# Patient Record
Sex: Female | Born: 1990 | Race: Black or African American | Hispanic: No | Marital: Single | Smoking: Never smoker
Health system: Southern US, Community
[De-identification: ages and names within clinical notes are randomized; demographics above are authoritative.]

## PROBLEM LIST (undated history)

## (undated) ENCOUNTER — Inpatient Hospital Stay: Payer: Self-pay

## (undated) DIAGNOSIS — E669 Obesity, unspecified: Secondary | ICD-10-CM

## (undated) DIAGNOSIS — E119 Type 2 diabetes mellitus without complications: Secondary | ICD-10-CM

## (undated) DIAGNOSIS — A6000 Herpesviral infection of urogenital system, unspecified: Secondary | ICD-10-CM

## (undated) HISTORY — PX: OTHER SURGICAL HISTORY: SHX169

---

## 2016-04-22 ENCOUNTER — Emergency Department
Admission: EM | Admit: 2016-04-22 | Discharge: 2016-04-22 | Disposition: A | Discharge status: Home / Self Care | Payer: BLUE CROSS/BLUE SHIELD | Attending: Emergency Medicine | Admitting: Emergency Medicine

## 2016-04-22 ENCOUNTER — Encounter: Payer: Self-pay | Admitting: Emergency Medicine

## 2016-04-22 ENCOUNTER — Emergency Department: Payer: BLUE CROSS/BLUE SHIELD

## 2016-04-22 DIAGNOSIS — Y939 Activity, unspecified: Secondary | ICD-10-CM | POA: Diagnosis not present

## 2016-04-22 DIAGNOSIS — M79604 Pain in right leg: Secondary | ICD-10-CM | POA: Insufficient documentation

## 2016-04-22 DIAGNOSIS — R52 Pain, unspecified: Secondary | ICD-10-CM

## 2016-04-22 DIAGNOSIS — Y929 Unspecified place or not applicable: Secondary | ICD-10-CM | POA: Diagnosis not present

## 2016-04-22 DIAGNOSIS — Y999 Unspecified external cause status: Secondary | ICD-10-CM | POA: Diagnosis not present

## 2016-04-22 DIAGNOSIS — W1839XA Other fall on same level, initial encounter: Secondary | ICD-10-CM | POA: Insufficient documentation

## 2016-04-22 DIAGNOSIS — E119 Type 2 diabetes mellitus without complications: Secondary | ICD-10-CM | POA: Diagnosis not present

## 2016-04-22 HISTORY — DX: Type 2 diabetes mellitus without complications: E11.9

## 2016-04-22 MED ORDER — TRAMADOL HCL 50 MG PO TABS: 50.0000 mg | ORAL_TABLET | Freq: Four times a day (QID) | ORAL | 0 refills | Status: DC | PRN

## 2016-04-22 MED ORDER — GLIPIZIDE 5 MG PO TABS: 5.0000 mg | ORAL_TABLET | Freq: Every day | ORAL | 0 refills | Status: AC

## 2016-04-22 MED ORDER — TRAMADOL HCL 50 MG PO TABS
50.0000 mg | ORAL_TABLET | Freq: Once | ORAL | Status: AC
  Administered 2016-04-22: 50 mg via ORAL
  Filled 2016-04-22: qty 1

## 2016-04-22 NOTE — ED Notes (Signed)
Pt returned from ultrasound

## 2016-04-22 NOTE — ED Provider Notes (Signed)
[facility] Emergency Department Provider Note   ____________________________________________   First MD Initiated Contact with Patient 04/22/16 [phone]     (approximate)  I have reviewed the triage vital signs and the nursing notes.   HISTORY  Chief Complaint Leg Pain    HPI Gloria Bennett is a 25 y.o. female who comes into the hospital today with some right leg pain after a fall. The patient reports that she fell approximately 3 days ago. She reports that since then she's been having pain with her leg. The patient reports that she has been sitting on the floor. She had gotten up and when she took a step on her right leg gave out. The patient reports that she fell and now has pain in the back of her calf. She's been taking Tylenol and Motrin but it has not been helping. The patient was the pain a 7 out of 10 in intensity. She's never had pain like this before. She said she does have some tingling in her toes.   Past Medical History:  Diagnosis Date  . Diabetes mellitus without complication (HCC)     There are no active problems to display for this patient.   History reviewed. No pertinent surgical history.  Prior to Admission medications   Medication Sig Start Date End Date Taking? Authorizing Provider  traMADol (ULTRAM) 50 MG tablet Take 1 tablet (50 mg total) by mouth every 6 (six) hours as needed. 04/22/16   Rebecka ApleyAllison P Naziah Weckerly, MD    Allergies Review of patient's allergies indicates no known allergies.  No family history on file.  Social History Social History  Substance Use Topics  . Smoking status: Never Smoker  . Smokeless tobacco: Not on file  . Alcohol use No    Review of Systems Constitutional: No fever/chills Eyes: No visual changes. ENT: No sore throat. Cardiovascular: Denies chest pain. Respiratory: Denies shortness of breath. Gastrointestinal: No abdominal pain.  No nausea, no vomiting.  No diarrhea.  No  constipation. Genitourinary: Negative for dysuria. Musculoskeletal: Right leg pain Skin: Negative for rash. Neurological: Negative for headaches, focal weakness or numbness.  10-point ROS otherwise negative.  ____________________________________________   PHYSICAL EXAM:  VITAL SIGNS: ED Triage Vitals  Enc Vitals Group     BP 04/22/16 0100 123/68     Pulse Rate 04/22/16 0100 78     Resp 04/22/16 0100 18     Temp 04/22/16 0100 98.1 F (36.7 C)     Temp Source 04/22/16 0100 Oral     SpO2 04/22/16 0100 99 %     Weight 04/22/16 0058 264 lb (119.7 kg)     Height 04/22/16 0058 5\' 6"  (1.676 m)     Head Circumference --      Peak Flow --      Pain Score 04/22/16 0058 7     Pain Loc --      Pain Edu? --      Excl. in GC? --     Constitutional: Alert and oriented. Well appearing and in no acute distress. Eyes: Conjunctivae are normal. PERRL. EOMI. Head: Atraumatic. Nose: No congestion/rhinnorhea. Mouth/Throat: Mucous membranes are moist.  Oropharynx non-erythematous. Cardiovascular: Normal rate, regular rhythm. Grossly normal heart sounds.  Good peripheral circulation. Respiratory: Normal respiratory effort.  No retractions. Lungs CTAB. Gastrointestinal: Soft and nontender. No distention. No abdominal bruits. No CVA tenderness. Musculoskeletal: Tenderness to palpation along right calf muscle Neurologic:  Normal speech and language.  Skin:  Skin is warm, dry  and intact.  Psychiatric: Mood and affect are normal.   ____________________________________________   LABS (all labs ordered are listed, but only abnormal results are displayed)  Labs Reviewed - No data to display ____________________________________________  EKG  none ____________________________________________  RADIOLOGY  X-ray right tib-fib Ultrasound right leg ____________________________________________   PROCEDURES  Procedure(s) performed: None  Procedures  Critical Care performed:  No  ____________________________________________   INITIAL IMPRESSION / ASSESSMENT AND PLAN / ED COURSE  Pertinent labs & imaging results that were available during my care of the patient were reviewed by me and considered in my medical decision making (see chart for details).  This is a 25 year old female who comes into the hospital with pain in her right leg. She reports it started after she fell but she has been sitting on the floor for some time. The patient did have an x-ray to evaluate her leg. I will give the patient dose of Toradol and tender as well for an ultrasound to evaluate for possible DVT.  Clinical Course  Value Comment By Time  DG Tibia/Fibula Right Negative Rebecka Apley, MD 09/19 0410  US Venous Img Lower Unilateral Right No evidence of deep venous thrombosis. Rebecka Apley, MD 09/19 0600    The patient's x-ray and ultrasound are negative. She was sleeping when I went into the room to reevaluate her. The patient will be discharged home to follow-up with her primary care physician. ____________________________________________   FINAL CLINICAL IMPRESSION(S) / ED DIAGNOSES  Final diagnoses:  Pain  Right leg pain      NEW MEDICATIONS STARTED DURING THIS VISIT:  New Prescriptions   TRAMADOL (ULTRAM) 50 MG TABLET    Take 1 tablet (50 mg total) by mouth every 6 (six) hours as needed.     Note:  This document was prepared using Dragon voice recognition software and may include unintentional dictation errors.    Rebecka Apley, MD 04/22/16 [phone]

## 2016-04-22 NOTE — ED Notes (Signed)
Pt went to ultrasound.

## 2016-04-22 NOTE — ED Triage Notes (Signed)
Patient ambulatory to triage with steady gait, without difficulty or distress noted; pt reports on Friday, stood up and fell; c/o persistent right lower leg pain

## 2016-10-10 ENCOUNTER — Emergency Department
Admission: EM | Admit: 2016-10-10 | Discharge: 2016-10-10 | Disposition: A | Discharge status: Home / Self Care | Payer: BLUE CROSS/BLUE SHIELD | Attending: Emergency Medicine | Admitting: Emergency Medicine

## 2016-10-10 DIAGNOSIS — E119 Type 2 diabetes mellitus without complications: Secondary | ICD-10-CM | POA: Insufficient documentation

## 2016-10-10 DIAGNOSIS — Z7984 Long term (current) use of oral hypoglycemic drugs: Secondary | ICD-10-CM | POA: Diagnosis not present

## 2016-10-10 DIAGNOSIS — L02215 Cutaneous abscess of perineum: Secondary | ICD-10-CM | POA: Insufficient documentation

## 2016-10-10 DIAGNOSIS — L0291 Cutaneous abscess, unspecified: Secondary | ICD-10-CM

## 2016-10-10 DIAGNOSIS — Z79899 Other long term (current) drug therapy: Secondary | ICD-10-CM | POA: Diagnosis not present

## 2016-10-10 MED ORDER — FLUCONAZOLE 150 MG PO TABS: 150.0000 mg | ORAL_TABLET | Freq: Every day | ORAL | 0 refills | Status: DC

## 2016-10-10 MED ORDER — LIDOCAINE HCL (PF) 1 % IJ SOLN
10.0000 mL | Freq: Once | INTRAMUSCULAR | Status: AC
  Administered 2016-10-10: 10 mL via INTRADERMAL

## 2016-10-10 MED ORDER — SULFAMETHOXAZOLE-TRIMETHOPRIM 800-160 MG PO TABS
1.0000 | ORAL_TABLET | Freq: Once | ORAL | Status: AC
  Administered 2016-10-10: 1 via ORAL
  Filled 2016-10-10: qty 1

## 2016-10-10 MED ORDER — LIDOCAINE HCL (PF) 1 % IJ SOLN
INTRAMUSCULAR | Status: AC
  Administered 2016-10-10: 10 mL via INTRADERMAL
  Filled 2016-10-10: qty 10

## 2016-10-10 MED ORDER — BACITRACIN ZINC 500 UNIT/GM EX OINT
TOPICAL_OINTMENT | CUTANEOUS | Status: AC
  Administered 2016-10-10: 1
  Filled 2016-10-10: qty 0.9

## 2016-10-10 MED ORDER — SULFAMETHOXAZOLE-TRIMETHOPRIM 800-160 MG PO TABS: 1.0000 | ORAL_TABLET | Freq: Two times a day (BID) | ORAL | 0 refills | Status: AC

## 2016-10-10 NOTE — ED Notes (Signed)
Dr. Manson PasseyBrown at bedside to drain abscess. This RN present as witness. Patient tolerated procedure well. Will continue to monitor.

## 2016-10-10 NOTE — ED Provider Notes (Signed)
[facility] Emergency Department Provider Note    First MD Initiated Contact with Patient 10/10/16 0327     (approximate)  I have reviewed the triage vital signs and the nursing notes.   HISTORY  Chief Complaint Abscess   HPI Gloria Bennett is a 26 y.o. female history diabetes presents with abscess in her vaginal area 4 days. Patient states area has not drained despite the fact that she is use warm soaks without any relief. Patient states her current pain score 6 out of 10. Patient denies any fever or febrile on presentation temperature 98.4.   Past Medical History:  Diagnosis Date  . Diabetes mellitus without complication (HCC)     There are no active problems to display for this patient.   Past surgical history None  Prior to Admission medications   Medication Sig Start Date End Date Taking? Authorizing Provider  glipiZIDE (GLUCOTROL) 5 MG tablet Take 1 tablet (5 mg total) by mouth daily before breakfast. 04/22/16 04/22/17  Rebecka ApleyAllison P Webster, MD  traMADol (ULTRAM) 50 MG tablet Take 1 tablet (50 mg total) by mouth every 6 (six) hours as needed. 04/22/16   Rebecka ApleyAllison P Webster, MD    Allergies Penicillins  No family history on file.  Social History Social History  Substance Use Topics  . Smoking status: Never Smoker  . Smokeless tobacco: Not on file  . Alcohol use No    Review of Systems Constitutional: No fever/chills Eyes: No visual changes. ENT: No sore throat. Cardiovascular: Denies chest pain. Respiratory: Denies shortness of breath. Gastrointestinal: No abdominal pain.  No nausea, no vomiting.  No diarrhea.  No constipation. Genitourinary: Negative for dysuria. Musculoskeletal: Negative for back pain. Skin: Negative for rash.Positive for abscess in the vaginal area Neurological: Negative for headaches, focal weakness or numbness.  10-point ROS otherwise negative.  ____________________________________________   PHYSICAL  EXAM:  VITAL SIGNS: ED Triage Vitals  Enc Vitals Group     BP 10/10/16 0027 135/72     Pulse Rate 10/10/16 0027 94     Resp 10/10/16 0027 18     Temp 10/10/16 0027 98.4 F (36.9 C)     Temp Source 10/10/16 0027 Oral     SpO2 10/10/16 0027 96 %     Weight 10/10/16 0027 270 lb (122.5 kg)     Height 10/10/16 0027 5\' 6"  (1.676 m)     Head Circumference --      Peak Flow --      Pain Score 10/10/16 0028 8     Pain Loc --      Pain Edu? --      Excl. in GC? --     Constitutional: Alert and oriented. Well appearing and in no acute distress. Eyes: Conjunctivae are normal. PERRL. EOMI. Head: Atraumatic. Ears:  Healthy appearing ear canals and TMs bilaterally Nose: No congestion/rhinnorhea. Mouth/Throat: Mucous membranes are moist.  Oropharynx non-erythematous. Neck: No stridor.  Cardiovascular: Normal rate, regular rhythm. Good peripheral circulation. Grossly normal heart sounds. Respiratory: Normal respiratory effort.  No retractions. Lungs CTAB. Gastrointestinal: Soft and nontender. No distention.  Genitourinary: 3 x 3 area of flocculence and swelling noted left inguinal area adjacent to the labia majora with overlying furuncle Musculoskeletal: No lower extremity tenderness nor edema. No gross deformities of extremities. Neurologic:  Normal speech and language. No gross focal neurologic deficits are appreciated.  Skin:  Skin is warm, dry and intact. No rash noted.    Marland Kitchen..Incision and Drainage Date/Time: 10/10/2016 7:16  AM Performed by: Darci Current Authorized by: Darci Current   Consent:    Consent obtained:  Verbal   Consent given by:  Patient   Risks discussed:  Bleeding, incomplete drainage and pain   Alternatives discussed:  No treatment Location:    Type:  Abscess   Location:  Anogenital   Anogenital location:  Perineum Pre-procedure details:    Skin preparation:  Chloraprep Anesthesia (see MAR for exact dosages):    Anesthesia method:  Local infiltration    Local anesthetic:  Lidocaine 1% w/o epi Procedure details:    Needle aspiration: no     Incision types:  Stab incision   Incision depth:  Subcutaneous   Scalpel blade:  11   Drainage:  Purulent and bloody   Drainage amount:  Moderate Post-procedure details:    Patient tolerance of procedure:  Tolerated well, no immediate complications        INITIAL IMPRESSION / ASSESSMENT AND PLAN / ED COURSE  Pertinent labs & imaging results that were available during my care of the patient were reviewed by me and considered in my medical decision making (see chart for details).  Patient given Bactrim the emergency department will be prescribed same for home. Patient is given warning signs of worsening infection which would more return to the emergency department.      ____________________________________________  FINAL CLINICAL IMPRESSION(S) / ED DIAGNOSES  Final diagnoses:  Abscess     MEDICATIONS GIVEN DURING THIS VISIT:  Medications  sulfamethoxazole-trimethoprim (BACTRIM DS,SEPTRA DS) 800-160 MG per tablet 1 tablet (1 tablet Oral Given 10/10/16 0537)  lidocaine (PF) (XYLOCAINE) 1 % injection 10 mL (10 mLs Intradermal Given 10/10/16 0537)  bacitracin 500 UNIT/GM ointment (1 application  Given 10/10/16 0537)     NEW OUTPATIENT MEDICATIONS STARTED DURING THIS VISIT:  New Prescriptions   No medications on file    Modified Medications   No medications on file    Discontinued Medications   No medications on file     Note:  This document was prepared using Dragon voice recognition software and may include unintentional dictation errors.    Darci Current, MD 10/10/16 [phone]
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
## 2017-04-06 ENCOUNTER — Emergency Department
Admission: EM | Admit: 2017-04-06 | Discharge: 2017-04-06 | Disposition: A | Discharge status: Home / Self Care | Payer: BLUE CROSS/BLUE SHIELD | Attending: Emergency Medicine | Admitting: Emergency Medicine

## 2017-04-06 ENCOUNTER — Emergency Department: Payer: BLUE CROSS/BLUE SHIELD

## 2017-04-06 DIAGNOSIS — E119 Type 2 diabetes mellitus without complications: Secondary | ICD-10-CM | POA: Diagnosis not present

## 2017-04-06 DIAGNOSIS — Z7984 Long term (current) use of oral hypoglycemic drugs: Secondary | ICD-10-CM | POA: Insufficient documentation

## 2017-04-06 DIAGNOSIS — Z79899 Other long term (current) drug therapy: Secondary | ICD-10-CM | POA: Insufficient documentation

## 2017-04-06 DIAGNOSIS — S63641A Sprain of metacarpophalangeal joint of right thumb, initial encounter: Secondary | ICD-10-CM | POA: Insufficient documentation

## 2017-04-06 DIAGNOSIS — Y998 Other external cause status: Secondary | ICD-10-CM | POA: Diagnosis not present

## 2017-04-06 DIAGNOSIS — Y929 Unspecified place or not applicable: Secondary | ICD-10-CM | POA: Diagnosis not present

## 2017-04-06 DIAGNOSIS — S6991XA Unspecified injury of right wrist, hand and finger(s), initial encounter: Secondary | ICD-10-CM | POA: Diagnosis present

## 2017-04-06 DIAGNOSIS — Y9389 Activity, other specified: Secondary | ICD-10-CM | POA: Insufficient documentation

## 2017-04-06 MED ORDER — OXYCODONE-ACETAMINOPHEN 5-325 MG PO TABS
1.0000 | ORAL_TABLET | Freq: Once | ORAL | Status: AC
  Administered 2017-04-06: 1 via ORAL
  Filled 2017-04-06: qty 1

## 2017-04-06 MED ORDER — TRAMADOL HCL 50 MG PO TABS: 50.0000 mg | ORAL_TABLET | Freq: Four times a day (QID) | ORAL | 0 refills | Status: DC | PRN

## 2017-04-06 MED ORDER — BACITRACIN ZINC 500 UNIT/GM EX OINT
TOPICAL_OINTMENT | Freq: Two times a day (BID) | CUTANEOUS | Status: DC
  Administered 2017-04-06: 1 via TOPICAL
  Filled 2017-04-06: qty 0.9

## 2017-04-06 MED ORDER — IBUPROFEN 800 MG PO TABS
800.0000 mg | ORAL_TABLET | Freq: Once | ORAL | Status: AC
  Administered 2017-04-06: 800 mg via ORAL
  Filled 2017-04-06: qty 1

## 2017-04-06 MED ORDER — NAPROXEN 500 MG PO TABS
500.0000 mg | ORAL_TABLET | Freq: Two times a day (BID) | ORAL | Status: DC
Start: 2017-04-06 — End: 2018-03-26
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
## 2017-04-06 NOTE — Discharge Instructions (Signed)
Wear splint for 3-5 days as needed. °

## 2017-10-13 ENCOUNTER — Encounter: Payer: Self-pay | Admitting: Emergency Medicine

## 2017-10-13 ENCOUNTER — Other Ambulatory Visit: Payer: Self-pay

## 2017-10-13 ENCOUNTER — Emergency Department
Admission: EM | Admit: 2017-10-13 | Discharge: 2017-10-13 | Disposition: A | Discharge status: Home / Self Care | Payer: BLUE CROSS/BLUE SHIELD | Attending: Emergency Medicine | Admitting: Emergency Medicine

## 2017-10-13 DIAGNOSIS — Z7984 Long term (current) use of oral hypoglycemic drugs: Secondary | ICD-10-CM | POA: Diagnosis not present

## 2017-10-13 DIAGNOSIS — H6501 Acute serous otitis media, right ear: Secondary | ICD-10-CM | POA: Insufficient documentation

## 2017-10-13 DIAGNOSIS — Z79899 Other long term (current) drug therapy: Secondary | ICD-10-CM | POA: Diagnosis not present

## 2017-10-13 DIAGNOSIS — E119 Type 2 diabetes mellitus without complications: Secondary | ICD-10-CM | POA: Insufficient documentation

## 2017-10-13 DIAGNOSIS — H9201 Otalgia, right ear: Secondary | ICD-10-CM | POA: Diagnosis present

## 2017-10-13 DIAGNOSIS — J00 Acute nasopharyngitis [common cold]: Secondary | ICD-10-CM | POA: Diagnosis not present

## 2017-10-13 LAB — GROUP A STREP BY PCR: Group A Strep by PCR: NOT DETECTED

## 2017-10-13 MED ORDER — FLUTICASONE PROPIONATE 50 MCG/ACT NA SUSP: 2.0000 | Freq: Every day | NASAL | 0 refills | Status: DC

## 2017-10-13 NOTE — ED Provider Notes (Signed)
[facility] Emergency Department Provider Note ____________________________________________  Time seen: 2008  I have reviewed the triage vital signs and the nursing notes.  HISTORY  Chief Complaint  Sore Throat and Otalgia  HPI Gloria Bennett is a 27 y.o. female resents to the ED with complaints of sore throat and right-sided earache for the last week.  Patient describes her last 2 days she has had increasing pressure, fullness, and aching to the right ear.  She notes now muffled hearing on that side.  She denies any fevers, chills, sweats, nausea, vomiting, or dizziness.  She also reports that she is approximately [redacted] weeks pregnant.  She has taken Tylenol without significant benefit.  Past Medical History:  Diagnosis Date  . Diabetes mellitus without complication (HCC)     There are no active problems to display for this patient.   No past surgical history on file.  Prior to Admission medications   Medication Sig Start Date End Date Taking? Authorizing Provider  fluconazole (DIFLUCAN) 150 MG tablet Take 1 tablet (150 mg total) by mouth daily. 10/10/16   Darci Current, MD  fluticasone (FLONASE) 50 MCG/ACT nasal spray Place 2 sprays into both nostrils daily. 10/13/17   Penny Frisbie, Charlesetta Ivory, PA-C  glipiZIDE (GLUCOTROL) 5 MG tablet Take 1 tablet (5 mg total) by mouth daily before breakfast. 04/22/16 04/22/17  Rebecka Apley, MD  naproxen (NAPROSYN) 500 MG tablet Take 1 tablet (500 mg total) by mouth 2 (two) times daily with a meal. 04/06/17   Joni Reining, PA-C  traMADol (ULTRAM) 50 MG tablet Take 1 tablet (50 mg total) by mouth every 6 (six) hours as needed. 04/22/16   Rebecka Apley, MD  traMADol (ULTRAM) 50 MG tablet Take 1 tablet (50 mg total) by mouth every 6 (six) hours as needed for moderate pain. 04/06/17   Joni Reining, PA-C    Allergies Amoxicillin and Penicillins  No family history on file.  Social History Social History   Tobacco  Use  . Smoking status: Never Smoker  Substance Use Topics  . Alcohol use: No  . Drug use: Not on file    Review of Systems  Constitutional: Negative for fever. Eyes: Negative for visual changes. ENT: Positive for sore throat.  The ear pressure and fullness as above.  Cardiovascular: Negative for chest pain. Respiratory: Negative for shortness of breath. Musculoskeletal: Negative for back pain. Skin: Negative for rash. Neurological: Negative for headaches, focal weakness or numbness. ____________________________________________  PHYSICAL EXAM:  VITAL SIGNS: ED Triage Vitals [10/13/17 1909]  Enc Vitals Group     BP 127/66     Pulse Rate 90     Resp 18     Temp 98.4 F (36.9 C)     Temp Source Oral     SpO2 98 %     Weight 254 lb (115.2 kg)     Height 5\' 6"  (1.676 m)     Head Circumference      Peak Flow      Pain Score 8     Pain Loc      Pain Edu?      Excl. in GC?     Constitutional: Alert and oriented. Well appearing and in no distress. Head: Normocephalic and atraumatic. Eyes: Conjunctivae are normal. PERRL. Normal extraocular movements Ears: Canals clear. TMs intact bilaterally.  The right TM is injected, bulging, and shows a serous effusion. Nose: No congestion/rhinorrhea/epistaxis.  Nasal turbinates are enlarged with copious rhinorrhea noted  in the right nostril. Mouth/Throat: Mucous membranes are moist.  Uvula is midline and tonsils are flat.  No oropharyngeal lesions or erythema appreciated. Neck: Supple. No thyromegaly. Hematological/Lymphatic/Immunological: No cervical lymphadenopathy. Cardiovascular: Normal rate, regular rhythm. Normal distal pulses. Respiratory: Normal respiratory effort. No wheezes/rales/rhonchi. Gastrointestinal: Soft and nontender. No distention. ____________________________________________   LABS (pertinent positives/negatives) Labs Reviewed  GROUP A STREP BY PCR  ____________________________________________  INITIAL IMPRESSION  / ASSESSMENT AND PLAN / ED COURSE  Patient had [redacted] weeks gestational age with complaints of sore throat and right otalgia.  She is found to have an acute serous otitis on the right and likely symptoms due to vasomotor rhinitis.  Due to her pregnancy state, she is advised she may safely take anti-histamines as well as nasal steroids.  She will avoid decongestants and NSAIDs.  She will follow-up with her primary provider return to the ED as needed. ____________________________________________  FINAL CLINICAL IMPRESSION(S) / ED DIAGNOSES  Final diagnoses:  Right acute serous otitis media, recurrence not specified  Acute rhinitis      Koraline Phillipson, Charlesetta IvoryJenise V Bacon, PA-C 10/13/17 2356    Minna AntisPaduchowski, Kevin, MD 10/14/17 0003

## 2017-10-13 NOTE — ED Triage Notes (Signed)
Pt in with co sore throat and right sided earache since last week.

## 2017-10-13 NOTE — ED Notes (Signed)
Pt in reporting 8/10 aching throat and sharp ear pain. Hearing is muffled Onset 10/07/2017. Works at a nursing home. Took chloraseptic and throat lozenges for throat pain. Pt is [redacted] weeks pregnant so she only took tylenol for pain and reports no relief.

## 2017-10-13 NOTE — Discharge Instructions (Signed)
You have fluid behind the right ear drum, without signs of infection. You may safely tak OTC allergy medicines, (Benadryl, Allegra, Claritin, or Zyrtec). Use the nasal spray as directed. Follow-up with your provider for continued symptoms.

## 2017-11-14 ENCOUNTER — Encounter: Payer: Self-pay | Admitting: Emergency Medicine

## 2017-11-14 ENCOUNTER — Other Ambulatory Visit: Payer: Self-pay

## 2017-11-14 ENCOUNTER — Emergency Department
Admission: EM | Admit: 2017-11-14 | Discharge: 2017-11-14 | Disposition: A | Discharge status: Home / Self Care | Payer: BLUE CROSS/BLUE SHIELD | Attending: Emergency Medicine | Admitting: Emergency Medicine

## 2017-11-14 DIAGNOSIS — R1084 Generalized abdominal pain: Secondary | ICD-10-CM | POA: Diagnosis not present

## 2017-11-14 DIAGNOSIS — O210 Mild hyperemesis gravidarum: Secondary | ICD-10-CM | POA: Diagnosis present

## 2017-11-14 DIAGNOSIS — O9989 Other specified diseases and conditions complicating pregnancy, childbirth and the puerperium: Secondary | ICD-10-CM | POA: Diagnosis not present

## 2017-11-14 DIAGNOSIS — Z7984 Long term (current) use of oral hypoglycemic drugs: Secondary | ICD-10-CM | POA: Diagnosis not present

## 2017-11-14 DIAGNOSIS — Z3A11 11 weeks gestation of pregnancy: Secondary | ICD-10-CM | POA: Diagnosis not present

## 2017-11-14 DIAGNOSIS — O24111 Pre-existing diabetes mellitus, type 2, in pregnancy, first trimester: Secondary | ICD-10-CM | POA: Diagnosis not present

## 2017-11-14 LAB — CBC WITH DIFFERENTIAL/PLATELET
BASOS ABS: 0 10*3/uL (ref 0–0.1)
Basophils Relative: 0 %
EOS PCT: 1 %
Eosinophils Absolute: 0.1 10*3/uL (ref 0–0.7)
HEMATOCRIT: 40.7 % (ref 35.0–47.0)
Hemoglobin: 14.1 g/dL (ref 12.0–16.0)
LYMPHS ABS: 2.2 10*3/uL (ref 1.0–3.6)
Lymphocytes Relative: 34 %
MCH: 24.3 pg — AB (ref 26.0–34.0)
MCHC: 34.5 g/dL (ref 32.0–36.0)
MCV: 70.5 fL — ABNORMAL LOW (ref 80.0–100.0)
MONOS PCT: 8 %
Monocytes Absolute: 0.5 10*3/uL (ref 0.2–0.9)
NEUTROS ABS: 3.8 10*3/uL (ref 1.4–6.5)
Neutrophils Relative %: 57 %
PLATELETS: 242 10*3/uL (ref 150–440)
RBC: 5.78 MIL/uL — ABNORMAL HIGH (ref 3.80–5.20)
RDW: 15.3 % — AB (ref 11.5–14.5)
WBC: 6.7 10*3/uL (ref 3.6–11.0)

## 2017-11-14 LAB — COMPREHENSIVE METABOLIC PANEL
ALT: 22 U/L (ref 14–54)
AST: 17 U/L (ref 15–41)
Albumin: 4 g/dL (ref 3.5–5.0)
Alkaline Phosphatase: 46 U/L (ref 38–126)
Anion gap: 8 (ref 5–15)
BILIRUBIN TOTAL: 0.9 mg/dL (ref 0.3–1.2)
BUN: <5 mg/dL — ABNORMAL LOW (ref 6–20)
CHLORIDE: 103 mmol/L (ref 101–111)
CO2: 24 mmol/L (ref 22–32)
Calcium: 9.5 mg/dL (ref 8.9–10.3)
Creatinine, Ser: 0.47 mg/dL (ref 0.44–1.00)
Glucose, Bld: 104 mg/dL — ABNORMAL HIGH (ref 65–99)
POTASSIUM: 3.5 mmol/L (ref 3.5–5.1)
Sodium: 135 mmol/L (ref 135–145)
TOTAL PROTEIN: 7.3 g/dL (ref 6.5–8.1)

## 2017-11-14 LAB — URINALYSIS, COMPLETE (UACMP) WITH MICROSCOPIC
Bacteria, UA: NONE SEEN
Bilirubin Urine: NEGATIVE
GLUCOSE, UA: NEGATIVE mg/dL
Hgb urine dipstick: NEGATIVE
Ketones, ur: 80 mg/dL — AB
LEUKOCYTES UA: NEGATIVE
Nitrite: NEGATIVE
PH: 6 (ref 5.0–8.0)
Protein, ur: NEGATIVE mg/dL
SPECIFIC GRAVITY, URINE: 1.015 (ref 1.005–1.030)

## 2017-11-14 LAB — LIPASE, BLOOD: LIPASE: 29 U/L (ref 11–51)

## 2017-11-14 MED ORDER — ONDANSETRON HCL 4 MG/2ML IJ SOLN
INTRAMUSCULAR | Status: AC
  Filled 2017-11-14: qty 2

## 2017-11-14 MED ORDER — SODIUM CHLORIDE 0.9 % IV BOLUS
1000.0000 mL | Freq: Once | INTRAVENOUS | Status: AC
  Administered 2017-11-14: 1000 mL via INTRAVENOUS

## 2017-11-14 MED ORDER — PYRIDOXINE HCL 25 MG PO TABS: 25.0000 mg | ORAL_TABLET | Freq: Every evening | ORAL | 0 refills | Status: DC | PRN

## 2017-11-14 MED ORDER — ONDANSETRON HCL 4 MG/2ML IJ SOLN
4.0000 mg | Freq: Once | INTRAMUSCULAR | Status: AC
  Administered 2017-11-14: 4 mg via INTRAVENOUS

## 2017-11-14 MED ORDER — DOXYLAMINE SUCCINATE (SLEEP) 25 MG PO TABS: 25.0000 mg | ORAL_TABLET | Freq: Every evening | ORAL | 0 refills | Status: DC | PRN

## 2017-11-14 NOTE — ED Triage Notes (Signed)
Pt arrives ambulatory to triage with c/o emesis x 3 days. Pt has been having abdominal pain for a loner amount of time but "just started throwing up" 3 days ago. Pt is in NAD.

## 2017-11-14 NOTE — ED Notes (Signed)
Pt awake, up to restroom to void.

## 2017-11-14 NOTE — ED Notes (Signed)
Pt consuming ice chips.

## 2017-11-14 NOTE — ED Notes (Signed)
Pt complains of generalized abd pain for "weeks". Pt states she is 11 weeks gravid with first pregnancy. Pt states she does not have a appetite. Pt states she began having emesis 3 days pta. Pt states "I have 24/7 morning sickness". Pt appears in no acute distress, moist oral mucus membranes present, no skin tenting noted.

## 2017-11-14 NOTE — ED Provider Notes (Signed)
[facility] Emergency Department Provider Note  ___________________________________________   First MD Initiated Contact with Patient 11/14/17 [phone]     (approximate)  I have reviewed the triage vital signs and the nursing notes.   HISTORY  Chief Complaint Emesis During Pregnancy   HPI Gloria Bennett is a 27 y.o. female with a history of diabetes who is approximately [redacted] weeks pregnant who is presenting to the emergency department today with generalized abdominal pain as well as nausea and vomiting over the past 3 days.  The patient denies any vaginal bleeding or discharge.  Denies any burning with urination.  Says that she has been nauseous up to this point in the pregnancy but has not vomited.  Known IUP.  Not taking any antiemetics at home.  Patient is currently taking prenatal vitamins.  Past Medical History:  Diagnosis Date  . Diabetes mellitus without complication (HCC)     There are no active problems to display for this patient.   History reviewed. No pertinent surgical history.  Prior to Admission medications   Medication Sig Start Date End Date Taking? Authorizing Provider  glipiZIDE (GLUCOTROL) 5 MG tablet Take 1 tablet (5 mg total) by mouth daily before breakfast. 04/22/16 11/14/17 Yes Webster, Melchor Amour, MD  metFORMIN (GLUCOPHAGE) 500 MG tablet Take 500 mg by mouth 2 (two) times daily with a meal.   Yes [provider]  Prenatal Vit-Fe Fumarate-FA (PRENATAL MULTIVITAMIN) TABS tablet Take 1 tablet by mouth daily.   Yes [provider]  doxylamine, Sleep, (UNISOM) 25 MG tablet Take 1 tablet (25 mg total) by mouth at bedtime as needed (nausea and vomiting). 11/14/17   Myrna Blazer, MD  fluconazole (DIFLUCAN) 150 MG tablet Take 1 tablet (150 mg total) by mouth daily. 10/10/16   Darci Current, MD  fluticasone (FLONASE) 50 MCG/ACT nasal spray Place 2 sprays into both nostrils daily. 10/13/17   Menshew, Charlesetta Ivory,  PA-C  naproxen (NAPROSYN) 500 MG tablet Take 1 tablet (500 mg total) by mouth 2 (two) times daily with a meal. 04/06/17   Joni Reining, PA-C  pyridOXINE (VITAMIN B-6) 25 MG tablet Take 1 tablet (25 mg total) by mouth at bedtime as needed (nausea and vomiting). 11/14/17   Schaevitz, Myra Rude, MD  traMADol (ULTRAM) 50 MG tablet Take 1 tablet (50 mg total) by mouth every 6 (six) hours as needed. 04/22/16   Rebecka Apley, MD  traMADol (ULTRAM) 50 MG tablet Take 1 tablet (50 mg total) by mouth every 6 (six) hours as needed for moderate pain. 04/06/17   Joni Reining, PA-C    Allergies Amoxicillin and Penicillins  No family history on file.  Social History Social History   Tobacco Use  . Smoking status: Never Smoker  . Smokeless tobacco: Never Used  Substance Use Topics  . Alcohol use: No  . Drug use: Never    Review of Systems  Constitutional: No fever/chills Eyes: No visual changes. ENT: No sore throat. Cardiovascular: Denies chest pain. Respiratory: Denies shortness of breath. Gastrointestinal:  No diarrhea.  No constipation. Genitourinary: Negative for dysuria. Musculoskeletal: Negative for back pain. Skin: Negative for rash. Neurological: Negative for headaches, focal weakness or numbness.  ____________________________________________   PHYSICAL EXAM:  VITAL SIGNS: ED Triage Vitals  Enc Vitals Group     BP 11/14/17 0234 127/73     Pulse Rate 11/14/17 0234 95     Resp 11/14/17 0234 18     Temp 11/14/17 0234  98.6 F (37 C)     Temp Source 11/14/17 0234 Oral     SpO2 11/14/17 0234 99 %     Weight 11/14/17 0235 248 lb (112.5 kg)     Height 11/14/17 0235 5\' 6"  (1.676 m)     Head Circumference --      Peak Flow --      Pain Score 11/14/17 0235 7     Pain Loc --      Pain Edu? --      Excl. in GC? --     Constitutional: Alert and oriented. Well appearing and in no acute distress. Eyes: Conjunctivae are normal.  Head: Atraumatic. Nose: No  congestion/rhinnorhea. Mouth/Throat: Mucous membranes are moist.  Neck: No stridor.   Cardiovascular: Normal rate, regular rhythm. Grossly normal heart sounds.   Respiratory: Normal respiratory effort.  No retractions. Lungs CTAB. Gastrointestinal: Soft with mild and diffuse tenderness to palpation. No distention.  Musculoskeletal: No lower extremity tenderness nor edema.  No joint effusions. Neurologic:  Normal speech and language. No gross focal neurologic deficits are appreciated. Skin:  Skin is warm, dry and intact. No rash noted. Psychiatric: Mood and affect are normal. Speech and behavior are normal.  ____________________________________________   LABS (all labs ordered are listed, but only abnormal results are displayed)  Labs Reviewed  CBC WITH DIFFERENTIAL/PLATELET - Abnormal; Notable for the following components:      Result Value   RBC 5.78 (*)    MCV 70.5 (*)    MCH 24.3 (*)    RDW 15.3 (*)    All other components within normal limits  COMPREHENSIVE METABOLIC PANEL - Abnormal; Notable for the following components:   Glucose, Bld 104 (*)    BUN <5 (*)    All other components within normal limits  URINALYSIS, COMPLETE (UACMP) WITH MICROSCOPIC - Abnormal; Notable for the following components:   Color, Urine YELLOW (*)    APPearance HAZY (*)    Ketones, ur 80 (*)    Squamous Epithelial / LPF 6-30 (*)    All other components within normal limits  LIPASE, BLOOD   ____________________________________________  EKG   ____________________________________________  RADIOLOGY  Bedside ultrasound with fetal heart tones of 162 ____________________________________________   PROCEDURES  Procedure(s) performed:   Procedures  Critical Care performed:   ____________________________________________   INITIAL IMPRESSION / ASSESSMENT AND PLAN / ED COURSE  Pertinent labs & imaging results that were available during my care of the patient were reviewed by me and  considered in my medical decision making (see chart for details).  DDX: Hyperemesis gravidarum, threatened abortion, abdominal pain in pregnancy, nausea and vomiting in pregnancy, gastroenteritis, appendicitis As part of my medical decision making, I reviewed the following data within the electronic MEDICAL RECORD NUMBERNotes from recent outpatient visits  ----------------------------------------- 6:18 AM on 11/14/2017 -----------------------------------------  Patient at this time is able to tolerate crackers as well as p.o. fluids.  When I entered the room she is asleep but is easily awoken.  Patient to be discharged with Unisom as well as pyridoxine to be used as needed.  Patient has a follow-up appointment already this Friday [facility].  Abdominal pain likely secondary to vomiting.  Normal white blood cell count.  Reassuring fetal heart tones.  Unlikely to be acute intra-abdominal process. ____________________________________________   FINAL CLINICAL IMPRESSION(S) / ED DIAGNOSES  Hyperemesis gravidarum.    NEW MEDICATIONS STARTED DURING THIS VISIT:  New Prescriptions   DOXYLAMINE, SLEEP, (UNISOM) 25 MG  TABLET    Take 1 tablet (25 mg total) by mouth at bedtime as needed (nausea and vomiting).   PYRIDOXINE (VITAMIN B-6) 25 MG TABLET    Take 1 tablet (25 mg total) by mouth at bedtime as needed (nausea and vomiting).     Note:  This document was prepared using Dragon voice recognition software and may include unintentional dictation errors.     Myrna Blazer, MD 11/14/17 [phone]

## 2017-12-31 ENCOUNTER — Emergency Department
Admission: EM | Admit: 2017-12-31 | Discharge: 2017-12-31 | Disposition: A | Discharge status: Home / Self Care | Payer: BLUE CROSS/BLUE SHIELD | Attending: Student in an Organized Health Care Education/Training Program | Admitting: Student in an Organized Health Care Education/Training Program

## 2017-12-31 ENCOUNTER — Encounter: Payer: Self-pay | Admitting: Emergency Medicine

## 2017-12-31 ENCOUNTER — Other Ambulatory Visit: Payer: Self-pay

## 2017-12-31 DIAGNOSIS — Z3A15 15 weeks gestation of pregnancy: Secondary | ICD-10-CM | POA: Insufficient documentation

## 2017-12-31 DIAGNOSIS — O210 Mild hyperemesis gravidarum: Secondary | ICD-10-CM | POA: Insufficient documentation

## 2017-12-31 DIAGNOSIS — O24112 Pre-existing diabetes mellitus, type 2, in pregnancy, second trimester: Secondary | ICD-10-CM | POA: Diagnosis not present

## 2017-12-31 DIAGNOSIS — Z7984 Long term (current) use of oral hypoglycemic drugs: Secondary | ICD-10-CM | POA: Diagnosis not present

## 2017-12-31 DIAGNOSIS — O219 Vomiting of pregnancy, unspecified: Secondary | ICD-10-CM | POA: Diagnosis present

## 2017-12-31 LAB — COMPREHENSIVE METABOLIC PANEL
ALBUMIN: 4 g/dL (ref 3.5–5.0)
ALK PHOS: 46 U/L (ref 38–126)
ALT: 18 U/L (ref 14–54)
ANION GAP: 12 (ref 5–15)
AST: 21 U/L (ref 15–41)
BILIRUBIN TOTAL: 0.8 mg/dL (ref 0.3–1.2)
BUN: 6 mg/dL (ref 6–20)
CALCIUM: 9.6 mg/dL (ref 8.9–10.3)
CO2: 20 mmol/L — AB (ref 22–32)
Chloride: 103 mmol/L (ref 101–111)
Creatinine, Ser: 0.45 mg/dL (ref 0.44–1.00)
GFR calc non Af Amer: >60 mL/min (ref >60)
Glucose, Bld: 86 mg/dL (ref 65–99)
POTASSIUM: 3.3 mmol/L — AB (ref 3.5–5.1)
SODIUM: 135 mmol/L (ref 135–145)
TOTAL PROTEIN: 7.8 g/dL (ref 6.5–8.1)

## 2017-12-31 LAB — URINALYSIS, COMPLETE (UACMP) WITH MICROSCOPIC
BACTERIA UA: NONE SEEN
BILIRUBIN URINE: NEGATIVE
GLUCOSE, UA: NEGATIVE mg/dL
Hgb urine dipstick: NEGATIVE
KETONES UR: 80 mg/dL — AB
Leukocytes, UA: NEGATIVE
Nitrite: NEGATIVE
PROTEIN: 30 mg/dL — AB
Specific Gravity, Urine: 1.03 (ref 1.005–1.030)
pH: 5 (ref 5.0–8.0)

## 2017-12-31 LAB — CBC
HCT: 38.7 % (ref 35.0–47.0)
HEMOGLOBIN: 13.3 g/dL (ref 12.0–16.0)
MCH: 24.3 pg — ABNORMAL LOW (ref 26.0–34.0)
MCHC: 34.3 g/dL (ref 32.0–36.0)
MCV: 70.9 fL — ABNORMAL LOW (ref 80.0–100.0)
Platelets: 248 10*3/uL (ref 150–440)
RBC: 5.45 MIL/uL — AB (ref 3.80–5.20)
RDW: 16.7 % — ABNORMAL HIGH (ref 11.5–14.5)
WBC: 7.5 10*3/uL (ref 3.6–11.0)

## 2017-12-31 LAB — LIPASE, BLOOD: Lipase: 33 U/L (ref 11–51)

## 2017-12-31 LAB — HCG, QUANTITATIVE, PREGNANCY: hCG, Beta Chain, Quant, S: 21121 m[IU]/mL — ABNORMAL HIGH (ref <5)

## 2017-12-31 MED ORDER — DOXYLAMINE SUCCINATE (SLEEP) 25 MG PO TABS
25.0000 mg | ORAL_TABLET | Freq: Every day | ORAL | Status: DC
  Administered 2017-12-31: 25 mg via ORAL
  Filled 2017-12-31: qty 1

## 2017-12-31 MED ORDER — DOXYLAMINE-PYRIDOXINE 10-10 MG PO TBEC: 1.0000 | DELAYED_RELEASE_TABLET | Freq: Two times a day (BID) | ORAL | 0 refills | Status: AC

## 2017-12-31 MED ORDER — SODIUM CHLORIDE 0.9 % IV BOLUS
1000.0000 mL | Freq: Once | INTRAVENOUS | Status: AC
  Administered 2017-12-31: 1000 mL via INTRAVENOUS

## 2017-12-31 MED ORDER — METOCLOPRAMIDE HCL 5 MG/ML IJ SOLN
10.0000 mg | Freq: Once | INTRAMUSCULAR | Status: AC
  Administered 2017-12-31: 10 mg via INTRAVENOUS
  Filled 2017-12-31: qty 2

## 2017-12-31 MED ORDER — VITAMIN B-6 50 MG PO TABS
25.0000 mg | ORAL_TABLET | Freq: Every day | ORAL | Status: DC
  Administered 2017-12-31: 25 mg via ORAL
  Filled 2017-12-31: qty 0.5

## 2017-12-31 MED ORDER — DEXTROSE 5 % AND 0.45 % NACL IV BOLUS
1000.0000 mL | Freq: Once | INTRAVENOUS | Status: AC
  Administered 2017-12-31: 1000 mL via INTRAVENOUS
  Filled 2017-12-31: qty 1000

## 2017-12-31 NOTE — ED Provider Notes (Signed)
[facility] Emergency Department Provider Note    First MD Initiated Contact with Patient 12/31/17 1647     (approximate)  I have reviewed the triage vital signs and the nursing notes.   HISTORY  Chief Complaint Emesis During Pregnancy    HPI Gloria Bennett is a 27 y.o. female G1P0 who is roughly [redacted] weeks pregnant presents the ER for nausea and vomiting for the past week.  Patient has required previous admissions to hospital for hyperemesis.  States she was discharged home with likely just but ran out 1 week ago.  She did not follow-up with PCP or her primary OB/GYN despite having recurrence of her nausea vomiting and she did not have any refills.  Presented to the ER for persistent nausea and vomiting concern for dehydration.  Still feels the baby move.  Denies any vaginal discharge.  No dysuria.  No fevers.  No abdominal pain.    Past Medical History:  Diagnosis Date  . Diabetes mellitus without complication (HCC)    No family history on file. History reviewed. No pertinent surgical history. There are no active problems to display for this patient.     Prior to Admission medications   Medication Sig Start Date End Date Taking? Authorizing Provider  doxylamine, Sleep, (UNISOM) 25 MG tablet Take 1 tablet (25 mg total) by mouth at bedtime as needed (nausea and vomiting). 11/14/17   Myrna Blazer, MD  fluconazole (DIFLUCAN) 150 MG tablet Take 1 tablet (150 mg total) by mouth daily. 10/10/16   Darci Current, MD  fluticasone (FLONASE) 50 MCG/ACT nasal spray Place 2 sprays into both nostrils daily. 10/13/17   Menshew, Charlesetta Ivory, PA-C  glipiZIDE (GLUCOTROL) 5 MG tablet Take 1 tablet (5 mg total) by mouth daily before breakfast. 04/22/16 11/14/17  Rebecka Apley, MD  metFORMIN (GLUCOPHAGE) 500 MG tablet Take 500 mg by mouth 2 (two) times daily with a meal.    [provider]  naproxen (NAPROSYN) 500 MG tablet Take 1 tablet (500  mg total) by mouth 2 (two) times daily with a meal. 04/06/17   Joni Reining, PA-C  Prenatal Vit-Fe Fumarate-FA (PRENATAL MULTIVITAMIN) TABS tablet Take 1 tablet by mouth daily.    [provider]  pyridOXINE (VITAMIN B-6) 25 MG tablet Take 1 tablet (25 mg total) by mouth at bedtime as needed (nausea and vomiting). 11/14/17   Schaevitz, Myra Rude, MD  traMADol (ULTRAM) 50 MG tablet Take 1 tablet (50 mg total) by mouth every 6 (six) hours as needed. 04/22/16   Rebecka Apley, MD  traMADol (ULTRAM) 50 MG tablet Take 1 tablet (50 mg total) by mouth every 6 (six) hours as needed for moderate pain. 04/06/17   Joni Reining, PA-C    Allergies Amoxicillin and Penicillins    Social History Social History   Tobacco Use  . Smoking status: Never Smoker  . Smokeless tobacco: Never Used  Substance Use Topics  . Alcohol use: No  . Drug use: Never    Review of Systems Patient denies headaches, rhinorrhea, blurry vision, numbness, shortness of breath, chest pain, edema, cough, abdominal pain, nausea, vomiting, diarrhea, dysuria, fevers, rashes or hallucinations unless otherwise stated above in HPI. ____________________________________________   PHYSICAL EXAM:  VITAL SIGNS: Vitals:   12/31/17 1633  BP: 113/68  Pulse: 86  Resp: 19  Temp: 98.5 F (36.9 C)  SpO2: 99%    Constitutional: Alert and oriented. Well appearing and in no acute distress. Eyes:  Conjunctivae are normal.  Head: Atraumatic. Nose: No congestion/rhinnorhea. Mouth/Throat: Mucous membranes are moist.   Neck: No stridor. Painless ROM.  Cardiovascular: Normal rate, regular rhythm. Grossly normal heart sounds.  Good peripheral circulation. Respiratory: Normal respiratory effort.  No retractions. Lungs CTAB. Gastrointestinal: gravid, Soft and nontenderNo distention. No abdominal bruits. No CVA tenderness. Genitourinary: deferred Musculoskeletal: No lower extremity tenderness nor edema.  No joint  effusions. Neurologic:  Normal speech and language. No gross focal neurologic deficits are appreciated. No facial droop Skin:  Skin is warm, dry and intact. No rash noted. Psychiatric: Mood and affect are normal. Speech and behavior are normal.  ____________________________________________   LABS (all labs ordered are listed, but only abnormal results are displayed)  Results for orders placed or performed during the hospital encounter of 12/31/17 (from the past 24 hour(s))  Lipase, blood     Status: None   Collection Time: 12/31/17  4:35 PM  Result Value Ref Range   Lipase 33 11 - 51 U/L  Comprehensive metabolic panel     Status: Abnormal   Collection Time: 12/31/17  4:35 PM  Result Value Ref Range   Sodium 135 135 - 145 mmol/L   Potassium 3.3 (L) 3.5 - 5.1 mmol/L   Chloride 103 101 - 111 mmol/L   CO2 20 (L) 22 - 32 mmol/L   Glucose, Bld 86 65 - 99 mg/dL   BUN 6 6 - 20 mg/dL   Creatinine, Ser 4.09 0.44 - 1.00 mg/dL   Calcium 9.6 8.9 - 81.1 mg/dL   Total Protein 7.8 6.5 - 8.1 g/dL   Albumin 4.0 3.5 - 5.0 g/dL   AST 21 15 - 41 U/L   ALT 18 14 - 54 U/L   Alkaline Phosphatase 46 38 - 126 U/L   Total Bilirubin 0.8 0.3 - 1.2 mg/dL   GFR calc non Af Amer >60 >60 mL/min   GFR calc Af Amer >60 >60 mL/min   Anion gap 12 5 - 15  CBC     Status: Abnormal   Collection Time: 12/31/17  4:35 PM  Result Value Ref Range   WBC 7.5 3.6 - 11.0 K/uL   RBC 5.45 (H) 3.80 - 5.20 MIL/uL   Hemoglobin 13.3 12.0 - 16.0 g/dL   HCT 91.4 78.2 - 95.6 %   MCV 70.9 (L) 80.0 - 100.0 fL   MCH 24.3 (L) 26.0 - 34.0 pg   MCHC 34.3 32.0 - 36.0 g/dL   RDW 21.3 (H) 08.6 - 57.8 %   Platelets 248 150 - 440 K/uL  Urinalysis, Complete w Microscopic     Status: Abnormal   Collection Time: 12/31/17  4:35 PM  Result Value Ref Range   Color, Urine AMBER (A) YELLOW   APPearance HAZY (A) CLEAR   Specific Gravity, Urine 1.030 1.005 - 1.030   pH 5.0 5.0 - 8.0   Glucose, UA NEGATIVE NEGATIVE mg/dL   Hgb urine  dipstick NEGATIVE NEGATIVE   Bilirubin Urine NEGATIVE NEGATIVE   Ketones, ur 80 (A) NEGATIVE mg/dL   Protein, ur 30 (A) NEGATIVE mg/dL   Nitrite NEGATIVE NEGATIVE   Leukocytes, UA NEGATIVE NEGATIVE   RBC / HPF 0-5 0 - 5 RBC/hpf   WBC, UA 0-5 0 - 5 WBC/hpf   Bacteria, UA NONE SEEN NONE SEEN   Squamous Epithelial / LPF 0-5 0 - 5   Mucus PRESENT   hCG, quantitative, pregnancy     Status: Abnormal   Collection Time: 12/31/17  4:35 PM  Result Value Ref Range   hCG, Beta Chain, Quant, S 21,121 (H) <5 mIU/mL   ____________________________________________ ____________________________________________  RADIOLOGY  EMERGENCY DEPARTMENT [place] PREGNANCY "Study: Limited Ultrasound of the Pelvis for Pregnancy"  INDICATIONS:Pregnancy(required) Multiple views of the uterus and pelvic cavity were obtained in real-time with a multi-frequency probe.  APPROACH:Transabdominal  PERFORMED BY: Myself IMAGES ARCHIVED?: Yes LIMITATIONS: none PREGNANCY FREE FLUID:  ADNEXAL FINDINGS: GESTATIONAL AGE, ESTIMATE:  FETAL HEART RATE: 146 INTERPRETATION: reassuring fetal movement     ____________________________________________   PROCEDURES  Procedure(s) performed:  Procedures    Critical Care performed: no ____________________________________________   INITIAL IMPRESSION / ASSESSMENT AND PLAN / ED COURSE  Pertinent labs & imaging results that were available during my care of the patient were reviewed by me and considered in my medical decision making (see chart for details).  DDX: hyperemesis, enteritis, gastritis, dehydration  Angelita Harnack is a 27 y.o. who presents to the ED with persistent nausea and vomiting in pregnancy.  Patient without any fevers.  Her abdominal exam is soft and benign.  Ultrasound is reassuring.  Patient does have ketosis therefore she was given IV fluids as well as D5 half.  No evidence of acidosis.  Remains Hemodynamically stable.  Will give refill for diclegis.   Patient stable and appropriate for follow up as an outpatient.      As part of my medical decision making, I reviewed the following data within the electronic MEDICAL RECORD NUMBER Nursing notes reviewed and incorporated, Labs reviewed, notes from prior ED visits.   ____________________________________________   FINAL CLINICAL IMPRESSION(S) / ED DIAGNOSES  Final diagnoses:  Hyperemesis gravidarum      NEW MEDICATIONS STARTED DURING THIS VISIT:  New Prescriptions   No medications on file     Note:  This document was prepared using Dragon voice recognition software and may include unintentional dictation errors.    Willy Eddy, MD 12/31/17 [phone]

## 2017-12-31 NOTE — ED Triage Notes (Signed)
Patient presents to the ED for nausea and vomiting with pregnancy.  Patient denies abdominal pain.  Patient states she is almost 5 months pregnant.  Patient ambulatory with steady gait.   No obvious distress at this time.

## 2017-12-31 NOTE — ED Notes (Signed)
Pt presents with emesis in pregnancy [facility] 06/04/2018). Pt states she has had n/v x 1 week. She was hospitalized for hyperemesis gravidarum in April, was d/c with diclegis. She finished the prescription a week ago, and the emesis has returned. Pt is pt at [facility] and came here instead of calling them. Pt alert & oriented with NAD noted.
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
## 2018-03-26 ENCOUNTER — Other Ambulatory Visit: Payer: Self-pay

## 2018-03-26 ENCOUNTER — Observation Stay
Admission: EM | Admit: 2018-03-26 | Discharge: 2018-03-26 | Disposition: A | Discharge status: Home / Self Care | Payer: BLUE CROSS/BLUE SHIELD | Attending: Certified Nurse Midwife | Admitting: Certified Nurse Midwife

## 2018-03-26 DIAGNOSIS — O26893 Other specified pregnancy related conditions, third trimester: Principal | ICD-10-CM | POA: Insufficient documentation

## 2018-03-26 DIAGNOSIS — Z806 Family history of leukemia: Secondary | ICD-10-CM | POA: Diagnosis not present

## 2018-03-26 DIAGNOSIS — Z3A30 30 weeks gestation of pregnancy: Secondary | ICD-10-CM | POA: Insufficient documentation

## 2018-03-26 DIAGNOSIS — Z8249 Family history of ischemic heart disease and other diseases of the circulatory system: Secondary | ICD-10-CM | POA: Diagnosis not present

## 2018-03-26 DIAGNOSIS — Z823 Family history of stroke: Secondary | ICD-10-CM | POA: Diagnosis not present

## 2018-03-26 DIAGNOSIS — Z803 Family history of malignant neoplasm of breast: Secondary | ICD-10-CM | POA: Diagnosis not present

## 2018-03-26 DIAGNOSIS — N949 Unspecified condition associated with female genital organs and menstrual cycle: Secondary | ICD-10-CM | POA: Diagnosis present

## 2018-03-26 DIAGNOSIS — M545 Low back pain: Secondary | ICD-10-CM | POA: Diagnosis not present

## 2018-03-26 DIAGNOSIS — Z833 Family history of diabetes mellitus: Secondary | ICD-10-CM | POA: Diagnosis not present

## 2018-03-26 DIAGNOSIS — R109 Unspecified abdominal pain: Secondary | ICD-10-CM | POA: Insufficient documentation

## 2018-03-26 DIAGNOSIS — Z8489 Family history of other specified conditions: Secondary | ICD-10-CM | POA: Diagnosis not present

## 2018-03-26 DIAGNOSIS — O24913 Unspecified diabetes mellitus in pregnancy, third trimester: Secondary | ICD-10-CM | POA: Insufficient documentation

## 2018-03-26 DIAGNOSIS — O26899 Other specified pregnancy related conditions, unspecified trimester: Secondary | ICD-10-CM | POA: Diagnosis present

## 2018-03-26 HISTORY — DX: Herpesviral infection of urogenital system, unspecified: A60.00

## 2018-03-26 HISTORY — DX: Obesity, unspecified: E66.9

## 2018-03-26 LAB — URINALYSIS, COMPLETE (UACMP) WITH MICROSCOPIC
BILIRUBIN URINE: NEGATIVE
Bacteria, UA: NONE SEEN
GLUCOSE, UA: NEGATIVE mg/dL
HGB URINE DIPSTICK: NEGATIVE
KETONES UR: 20 mg/dL — AB
Leukocytes, UA: NEGATIVE
NITRITE: NEGATIVE
PH: 6 (ref 5.0–8.0)
Protein, ur: NEGATIVE mg/dL
SPECIFIC GRAVITY, URINE: 1.014 (ref 1.005–1.030)

## 2018-03-26 MED ORDER — ACETAMINOPHEN 500 MG PO TABS
ORAL_TABLET | ORAL | Status: AC
  Administered 2018-03-26: 1000 mg via ORAL
  Filled 2018-03-26: qty 2

## 2018-03-26 MED ORDER — ACETAMINOPHEN 500 MG PO TABS
1000.0000 mg | ORAL_TABLET | Freq: Four times a day (QID) | ORAL | Status: DC | PRN
  Administered 2018-03-26: 1000 mg via ORAL

## 2018-03-26 NOTE — Final Progress Note (Signed)
Physician Final Progress Note  Patient ID: Gloria Bennett MRN: [id number] DOB/AGE: Apr 23, 1991 27 y.o.  Admit date: 03/26/2018 Admitting provider: Conard Novak, MD/ Gasper Lloyd. Sharen Hones, CNM Discharge date: 03/26/2018   Admission Diagnoses: Lower abdominal and lower back pain in pregnancy  Discharge Diagnoses:  IUP at 30 weeks with probable bilateral round ligament pain Lower back pain left>right Possible left sciatica  Consults: None  Significant Findings/ Diagnostic Studies: 27 year old BF G1 P0 with EDC=06/04/2018 presented to L&D at [redacted] weeks gestation with complaints of sharp pains in lower abdomen that occur with certain movements, like turning over or getting up. They can be in either side of her lower abdomen and resolve with rest. She has also been having pain in her lumbar sacral area, left > right and the pain in the left side can radiate to her buttock and the back of her left thigh. 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  She denies vaginal bleeding, regular contractions, dysuria, diarrhea or vomiting. Baby is active. Has had some increased vaginal discharge, but no odor or itching. Tends toward constipation, but last BM was yesterday. Prenatal care at [facility] and has been complicated by obesity (last weight 268#), T2DM (currently on Metformin 500 mgm BID and glyburide 5 mgm in the AM), hyperemesis requiring admission this pregnancy, and a history of HSV.   Past Medical History:  Diagnosis Date  . Diabetes mellitus without complication (HCC)   . Herpes genitalis   . Obesity    Past Surgical History:  Procedure Laterality Date  . Repair of ligament in right hand     Family History  Problem Relation Age of Onset  . Diabetes Mellitus II Mother   . Hypertension Mother   . Diabetes Father   . Leukemia Maternal Grandmother   . Heart disease Maternal Grandmother   . Breast cancer Maternal Aunt    . Breast cancer Maternal Aunt    Social History   Socioeconomic History  . Marital status: Single    Spouse name: Not on file  . Number of children: 0  . Years of education: Not on file  . Highest education level: Not on file  Occupational History  . Occupation: CNA  Social Needs  . Financial resource strain: Not on file  . Food insecurity:    Worry: Not on file    Inability: Not on file  . Transportation needs:    Medical: Not on file    Non-medical: Not on file  Tobacco Use  . Smoking status: Never Smoker  . Smokeless tobacco: Never Used  Substance and Sexual Activity  . Alcohol use: No  . Drug use: Never  . Sexual activity: Not on file  Lifestyle  . Physical activity:    Days per week: Not on file    Minutes per session: Not on file  . Stress: Not on file  Relationships  . Social connections:    Talks on phone: Not on file    Gets together: Not on file    Attends religious service: Not on file    Active member of club or organization: Not on file    Attends meetings of clubs or organizations: Not on file    Relationship status: Not on file  . Intimate partner violence:    Fear of current or ex partner: Not on file    Emotionally abused:  Not on file    Physically abused: Not on file    Forced sexual activity: Not on file  Other Topics Concern  . Not on file  Social History Narrative  . Not on file   Exam: BP 126/71   Pulse 89   Temp (!) 97.5 F (36.4 C) (Oral)   Resp 16   LMP 08/28/2017   SpO2 100%   General: BF in NAD, splinted left lower quadrant when she turned to her right side Resp/Chest: normal respiratory effort Heart: RRR without murmur Abdomen: obese, gravid, tenderness in LLQ and RLQ with palpation near lower uterine border, soft. Upper abdomen NT and Uterus NT FHR: 135-140 baseline with accelerations to 150, moderate variability Toco: No contractions seen Back: no point tenderness in lumbar sacral area, no bruising, some edema of sacral  skin Extremities: no tenderness, +1 edema of LE Psyche: normal mood and affect Neuro: DTRS +1 (popliteal)  A: IUP at 30 weeks with bilateral round ligament pain Lower back pain (lumbago) Suspect left sciatica  P: Recommended Tylenol, heat and Boifreeze for back pain, maternity support garment for round ligament Recommned limiting lifting at work Given work note to excuse from work thru Sunday. [facility]   Procedures: none  Discharge Condition: stable  Disposition: Discharge disposition: 01-Home or Self Care       Diet: Diabetic diet  Discharge Activity: Activity as tolerated   Allergies as of 03/26/2018      Reactions   Amoxicillin Rash   Penicillins Rash      Medication List    TAKE these medications   aspirin 81 MG chewable tablet Chew 81 mg by mouth daily.   Doxylamine-Pyridoxine 10-10 MG Tbec Take 1 tablet by mouth 2 (two) times daily.   glipiZIDE 5 MG tablet Commonly known as:  GLUCOTROL Take 1 tablet (5 mg total) by mouth daily before breakfast.   metFORMIN 500 MG tablet Commonly known as:  GLUCOPHAGE Take 500 mg by mouth 2 (two) times daily with a meal.   prenatal multivitamin Tabs tablet Take 1 tablet by mouth daily.   ranitidine 150 MG tablet Commonly known as:  ZANTAC Take 150 mg by mouth 2 (two) times daily as needed for heartburn.   valACYclovir 500 MG tablet Commonly known as:  VALTREX Take 500 mg by mouth 2 (two) times daily as needed.        Total time spent taking care of this patient: 30 minutes  Signed: Farrel Connersolleen Kamyra Schroeck 03/26/2018, 4:28 PM

## 2018-03-26 NOTE — OB Triage Note (Signed)
Patient here for abdominal pain and swelling, states that within the last few days her ankles have doubled in size. Reports some headaches but nothing severe. She is also complaining of back pain that started around the same time as the abdominal pain

## 2018-03-28 LAB — URINE CULTURE: SPECIAL REQUESTS: NORMAL

## 2018-04-09 ENCOUNTER — Other Ambulatory Visit: Payer: Self-pay

## 2018-04-09 ENCOUNTER — Observation Stay
Admission: EM | Admit: 2018-04-09 | Discharge: 2018-04-10 | Disposition: A | Discharge status: Home / Self Care | Payer: BLUE CROSS/BLUE SHIELD | Attending: Obstetrics & Gynecology | Admitting: Obstetrics & Gynecology

## 2018-04-09 DIAGNOSIS — E119 Type 2 diabetes mellitus without complications: Secondary | ICD-10-CM | POA: Diagnosis not present

## 2018-04-09 DIAGNOSIS — O26899 Other specified pregnancy related conditions, unspecified trimester: Secondary | ICD-10-CM

## 2018-04-09 DIAGNOSIS — Z79899 Other long term (current) drug therapy: Secondary | ICD-10-CM | POA: Diagnosis not present

## 2018-04-09 DIAGNOSIS — O26893 Other specified pregnancy related conditions, third trimester: Principal | ICD-10-CM | POA: Insufficient documentation

## 2018-04-09 DIAGNOSIS — O24313 Unspecified pre-existing diabetes mellitus in pregnancy, third trimester: Secondary | ICD-10-CM | POA: Insufficient documentation

## 2018-04-09 DIAGNOSIS — E669 Obesity, unspecified: Secondary | ICD-10-CM | POA: Insufficient documentation

## 2018-04-09 DIAGNOSIS — Z3A32 32 weeks gestation of pregnancy: Secondary | ICD-10-CM | POA: Insufficient documentation

## 2018-04-09 DIAGNOSIS — Z7984 Long term (current) use of oral hypoglycemic drugs: Secondary | ICD-10-CM | POA: Diagnosis not present

## 2018-04-09 DIAGNOSIS — R1030 Lower abdominal pain, unspecified: Secondary | ICD-10-CM | POA: Diagnosis present

## 2018-04-09 DIAGNOSIS — M545 Low back pain: Secondary | ICD-10-CM | POA: Insufficient documentation

## 2018-04-09 DIAGNOSIS — Z7982 Long term (current) use of aspirin: Secondary | ICD-10-CM | POA: Insufficient documentation

## 2018-04-09 DIAGNOSIS — R109 Unspecified abdominal pain: Secondary | ICD-10-CM | POA: Insufficient documentation

## 2018-04-09 DIAGNOSIS — Z3490 Encounter for supervision of normal pregnancy, unspecified, unspecified trimester: Secondary | ICD-10-CM

## 2018-04-09 NOTE — OB Triage Note (Signed)
Pt presents stating she has been having "period cramping" since 1830. The pain has worsened and moved into her back. Pt states that she is diabetic and the plan is to induce at 37 weeks. Recently admitted to the hospital last week for nausea and vomiting. Denies fever, foul smelling vaginal discharge. Pt does endorse decreased fetal movement today.

## 2018-04-10 DIAGNOSIS — Z3490 Encounter for supervision of normal pregnancy, unspecified, unspecified trimester: Secondary | ICD-10-CM

## 2018-04-10 DIAGNOSIS — O26893 Other specified pregnancy related conditions, third trimester: Secondary | ICD-10-CM | POA: Diagnosis not present

## 2018-04-10 DIAGNOSIS — R1030 Lower abdominal pain, unspecified: Secondary | ICD-10-CM | POA: Diagnosis present

## 2018-04-10 DIAGNOSIS — R1032 Left lower quadrant pain: Secondary | ICD-10-CM | POA: Diagnosis present

## 2018-04-10 DIAGNOSIS — O26899 Other specified pregnancy related conditions, unspecified trimester: Secondary | ICD-10-CM

## 2018-04-10 LAB — URINALYSIS, ROUTINE W REFLEX MICROSCOPIC
Bilirubin Urine: NEGATIVE
GLUCOSE, UA: NEGATIVE mg/dL
Hgb urine dipstick: NEGATIVE
Ketones, ur: NEGATIVE mg/dL
LEUKOCYTES UA: NEGATIVE
Nitrite: NEGATIVE
PROTEIN: NEGATIVE mg/dL
Specific Gravity, Urine: 1.006 (ref 1.005–1.030)
pH: 7 (ref 5.0–8.0)

## 2018-04-10 MED ORDER — LACTATED RINGERS IV SOLN: INTRAVENOUS | Status: DC

## 2018-04-10 MED ORDER — OXYTOCIN 40 UNITS IN LACTATED RINGERS INFUSION - SIMPLE MED: 2.5000 [IU]/h | INTRAVENOUS | Status: DC

## 2018-04-10 MED ORDER — OXYTOCIN BOLUS FROM INFUSION: 500.0000 mL | Freq: Once | INTRAVENOUS | Status: DC

## 2018-04-10 MED ORDER — LACTATED RINGERS IV SOLN: 500.0000 mL | INTRAVENOUS | Status: DC | PRN

## 2018-04-10 MED ORDER — ACETAMINOPHEN 325 MG PO TABS: 650.0000 mg | ORAL_TABLET | ORAL | Status: DC | PRN

## 2018-04-10 MED ORDER — ONDANSETRON HCL 4 MG/2ML IJ SOLN: 4.0000 mg | Freq: Four times a day (QID) | INTRAMUSCULAR | Status: DC | PRN

## 2018-04-10 NOTE — Final Progress Note (Addendum)
Physician Final Progress Note  Patient ID: Gloria Bennett MRN: [id number] DOB/AGE: October 13, 1990 27 y.o.  Admit date: 04/09/2018 Admitting provider: Nadara Mustard, MD Discharge date: 04/10/2018  Admission Diagnoses: Abdominal pain, pregnancy, 32 weeks  Discharge Diagnoses:  Active Problems:   Pregnancy related bilateral lower abdominal pain, antepartum   Consults: None  Significant Findings/ Diagnostic Studies:   HPI:  27 y.o. G1P0 @ [redacted]w[redacted]d (06/04/2018, Date entered prior to episode creation). Admitted on 04/09/2018:   Patient Active Problem List   Diagnosis Date Noted  . Pregnancy related bilateral lower abdominal pain, antepartum 04/10/2018  . Pregnancy 04/10/2018  . Abdominal pain affecting pregnancy, antepartum 03/26/2018  . Round ligament pain 03/26/2018    Presents for lower abdominal pain and low back pain, worse today, no associated sx's and no modifiers today, has had off and on of recent note, now at 30 weeks pregnancy, PNC at [facility].  Denies VB or ROM. Prenatal care at [facility] and has been complicated by obesity (last weight 268#), T2DM (currently on Metformin 500 mgm BID and glyburide 5 mgm in the AM), hyperemesis requiring admission this pregnancy, and a history of HSV.   ROS: A review of systems was performed and negative, except as stated in the above HPI.  PMHx:  Past Medical History:  Diagnosis Date  . Diabetes mellitus without complication (HCC)   . Herpes genitalis   . Obesity    PSHx:  Past Surgical History:  Procedure Laterality Date  . Repair of ligament in right hand     Medications:  Medications Prior to Admission  Medication Sig Dispense Refill Last Dose  . aspirin 81 MG chewable tablet Chew 81 mg by mouth daily.   04/09/2018 at Unknown time  . Doxylamine-Pyridoxine 10-10 MG TBEC Take 1 tablet by mouth 2 (two) times daily. 60 tablet 0 04/09/2018 at Unknown time  . metFORMIN (GLUCOPHAGE) 500 MG tablet Take 500 mg by mouth 2 (two) times daily with a meal.    04/09/2018 at Unknown time  . Prenatal Vit-Fe Fumarate-FA (PRENATAL MULTIVITAMIN) TABS tablet Take 1 tablet by mouth daily.   04/09/2018 at Unknown time  . ranitidine (ZANTAC) 150 MG tablet Take 150 mg by mouth 2 (two) times daily as needed for heartburn.   04/09/2018 at Unknown time  . valACYclovir (VALTREX) 500 MG tablet Take 500 mg by mouth 2 (two) times daily as needed.   04/09/2018 at Unknown time  . glipiZIDE (GLUCOTROL) 5 MG tablet Take 1 tablet (5 mg total) by mouth daily before breakfast. 15 tablet 0    Allergies: is allergic to amoxicillin and penicillins. OBHx:  OB History  Gravida Para Term Preterm AB Living  1            SAB TAB Ectopic Multiple Live Births               # Outcome Date GA Lbr Len/2nd Weight Sex Delivery Anes PTL Lv  1 Current            IFO:YDXAJOIN/OMVEHMCNOBSJ except as detailed in HPI.Marland Kitchen  No family history of birth defects. Soc Hx: Alcohol: none and Recreational drug use: none  Objective:   Vitals:   04/09/18 2302  BP: (!) 146/82  Pulse: 85  Resp: 16  Temp: 98.3 F (36.8 C)   Constitutional: Well nourished, well developed female in no acute distress.  HEENT: normal Skin: Warm and dry.  Cardiovascular:Regular rate and rhythm.   Extremity: trace to 1+ bilateral pedal edema Respiratory: Clear to  auscultation bilateral. Normal respiratory effort Abdomen: mild Back: no CVAT Neuro: DTRs 2+, Cranial nerves grossly intact Psych: Alert and Oriented x3. No memory deficits. Normal mood and affect.  MS: normal gait, normal bilateral lower extremity ROM/strength/stability.  EFM:FHR:   140 bpm, variability: moderate,  accelerations:  Present,  decelerations:  Absent Toco: None    Procedures: A NST procedure was performed with FHR monitoring and a normal baseline established, appropriate time of 20-40 minutes of evaluation, and accels >2 seen w 15x15 characteristics.  Results show a REACTIVE NST.   Discharge Condition: good  Disposition: Discharge  disposition: 01-Home or Self Care  Diet: Regular diet  Discharge Activity: Activity as tolerated  Discharge Instructions    Call MD for:   Complete by:  As directed    Worsening contractions or pain; leakage of fluid; bleeding.   Diet general   Complete by:  As directed    Increase activity slowly   Complete by:  As directed      Allergies as of 04/10/2018      Reactions   Amoxicillin Rash   Penicillins Rash      Medication List    TAKE these medications   aspirin 81 MG chewable tablet Chew 81 mg by mouth daily.   Doxylamine-Pyridoxine 10-10 MG Tbec Take 1 tablet by mouth 2 (two) times daily.   glipiZIDE 5 MG tablet Commonly known as:  GLUCOTROL Take 1 tablet (5 mg total) by mouth daily before breakfast.   metFORMIN 500 MG tablet Commonly known as:  GLUCOPHAGE Take 500 mg by mouth 2 (two) times daily with a meal.   prenatal multivitamin Tabs tablet Take 1 tablet by mouth daily.   ranitidine 150 MG tablet Commonly known as:  ZANTAC Take 150 mg by mouth 2 (two) times daily as needed for heartburn.   valACYclovir 500 MG tablet Commonly known as:  VALTREX Take 500 mg by mouth 2 (two) times daily as needed.        Total time spent taking care of this patient: 15 minutes  Signed: Letitia Libra 04/10/2018, 1:04 AM

## 2018-04-10 NOTE — Discharge Instructions (Signed)
Braxton Hicks Contractions °Contractions of the uterus can occur throughout pregnancy, but they are not always a sign that you are in labor. You may have practice contractions called Braxton Hicks contractions. These false labor contractions are sometimes confused with true labor. °What are Braxton Hicks contractions? °Braxton Hicks contractions are tightening movements that occur in the muscles of the uterus before labor. Unlike true labor contractions, these contractions do not result in opening (dilation) and thinning of the cervix. Toward the end of pregnancy (32-34 weeks), Braxton Hicks contractions can happen more often and may become stronger. These contractions are sometimes difficult to tell apart from true labor because they can be very uncomfortable. You should not feel embarrassed if you go to the hospital with false labor. °Sometimes, the only way to tell if you are in true labor is for your health care provider to look for changes in the cervix. The health care provider will do a physical exam and may monitor your contractions. If you are not in true labor, the exam should show that your cervix is not dilating and your water has not broken. °If there are other health problems associated with your pregnancy, it is completely safe for you to be sent home with false labor. You may continue to have Braxton Hicks contractions until you go into true labor. °How to tell the difference between true labor and false labor °True labor °· Contractions last 30-70 seconds. °· Contractions become very regular. °· Discomfort is usually felt in the top of the uterus, and it spreads to the lower abdomen and low back. °· Contractions do not go away with walking. °· Contractions usually become more intense and increase in frequency. °· The cervix dilates and gets thinner. °False labor °· Contractions are usually shorter and not as strong as true labor contractions. °· Contractions are usually irregular. °· Contractions  are often felt in the front of the lower abdomen and in the groin. °· Contractions may go away when you walk around or change positions while lying down. °· Contractions get weaker and are shorter-lasting as time goes on. °· The cervix usually does not dilate or become thin. °Follow these instructions at home: °· Take over-the-counter and prescription medicines only as told by your health care provider. °· Keep up with your usual exercises and follow other instructions from your health care provider. °· Eat and drink lightly if you think you are going into labor. °· If Braxton Hicks contractions are making you uncomfortable: °? Change your position from lying down or resting to walking, or change from walking to resting. °? Sit and rest in a tub of warm water. °? Drink enough fluid to keep your urine pale yellow. Dehydration may cause these contractions. °? Do slow and deep breathing several times an hour. °· Keep all follow-up prenatal visits as told by your health care provider. This is important. °Contact a health care provider if: °· You have a fever. °· You have continuous pain in your abdomen. °Get help right away if: °· Your contractions become stronger, more regular, and closer together. °· You have fluid leaking or gushing from your vagina. °· You pass blood-tinged mucus (bloody show). °· You have bleeding from your vagina. °· You have low back pain that you never had before. °· You feel your baby’s head pushing down and causing pelvic pressure. °· Your baby is not moving inside you as much as it used to. °Summary °· Contractions that occur before labor are called Braxton   Hicks contractions, false labor, or practice contractions. °· Braxton Hicks contractions are usually shorter, weaker, farther apart, and less regular than true labor contractions. True labor contractions usually become progressively stronger and regular and they become more frequent. °· Manage discomfort from Braxton Hicks contractions by  changing position, resting in a warm bath, drinking plenty of water, or practicing deep breathing. °This information is not intended to replace advice given to you by your health care provider. Make sure you discuss any questions you have with your health care provider. °Document Released: 12/04/2016 Document Revised: 12/04/2016 Document Reviewed: 12/04/2016 °Elsevier Interactive Patient Education © 2018 [employer] ° °

## 2018-04-10 NOTE — Discharge Summary (Signed)
  See FPN 

## 2018-04-26 ENCOUNTER — Emergency Department: Payer: BLUE CROSS/BLUE SHIELD

## 2018-04-26 ENCOUNTER — Emergency Department
Admission: EM | Admit: 2018-04-26 | Discharge: 2018-04-26 | Disposition: A | Discharge status: Home / Self Care | Payer: BLUE CROSS/BLUE SHIELD | Attending: Emergency Medicine | Admitting: Emergency Medicine

## 2018-04-26 ENCOUNTER — Other Ambulatory Visit: Payer: Self-pay

## 2018-04-26 ENCOUNTER — Encounter: Payer: Self-pay | Admitting: Emergency Medicine

## 2018-04-26 DIAGNOSIS — Z7984 Long term (current) use of oral hypoglycemic drugs: Secondary | ICD-10-CM | POA: Diagnosis not present

## 2018-04-26 DIAGNOSIS — Z79899 Other long term (current) drug therapy: Secondary | ICD-10-CM | POA: Diagnosis not present

## 2018-04-26 DIAGNOSIS — E119 Type 2 diabetes mellitus without complications: Secondary | ICD-10-CM | POA: Insufficient documentation

## 2018-04-26 DIAGNOSIS — R600 Localized edema: Secondary | ICD-10-CM | POA: Diagnosis not present

## 2018-04-26 DIAGNOSIS — R609 Edema, unspecified: Secondary | ICD-10-CM

## 2018-04-26 DIAGNOSIS — Z7982 Long term (current) use of aspirin: Secondary | ICD-10-CM | POA: Diagnosis not present

## 2018-04-26 DIAGNOSIS — E876 Hypokalemia: Secondary | ICD-10-CM | POA: Diagnosis not present

## 2018-04-26 DIAGNOSIS — R2243 Localized swelling, mass and lump, lower limb, bilateral: Secondary | ICD-10-CM | POA: Diagnosis present

## 2018-04-26 LAB — URINALYSIS, COMPLETE (UACMP) WITH MICROSCOPIC
Bacteria, UA: NONE SEEN
Bilirubin Urine: NEGATIVE
GLUCOSE, UA: NEGATIVE mg/dL
Hgb urine dipstick: NEGATIVE
Ketones, ur: NEGATIVE mg/dL
Leukocytes, UA: NEGATIVE
Nitrite: NEGATIVE
PH: 7 (ref 5.0–8.0)
Protein, ur: NEGATIVE mg/dL
SPECIFIC GRAVITY, URINE: 1.011 (ref 1.005–1.030)

## 2018-04-26 LAB — CBC WITH DIFFERENTIAL/PLATELET
Basophils Absolute: 0 10*3/uL (ref 0–0.1)
Basophils Relative: 1 %
EOS ABS: 0.1 10*3/uL (ref 0–0.7)
Eosinophils Relative: 2 %
HCT: 38.6 % (ref 35.0–47.0)
Hemoglobin: 13.2 g/dL (ref 12.0–16.0)
LYMPHS ABS: 1.8 10*3/uL (ref 1.0–3.6)
Lymphocytes Relative: 32 %
MCH: 25.5 pg — AB (ref 26.0–34.0)
MCHC: 34.3 g/dL (ref 32.0–36.0)
MCV: 74.4 fL — AB (ref 80.0–100.0)
MONO ABS: 0.7 10*3/uL (ref 0.2–0.9)
Monocytes Relative: 12 %
NEUTROS ABS: 3 10*3/uL (ref 1.4–6.5)
NEUTROS PCT: 53 %
PLATELETS: 340 10*3/uL (ref 150–440)
RBC: 5.19 MIL/uL (ref 3.80–5.20)
RDW: 14 % (ref 11.5–14.5)
WBC: 5.7 10*3/uL (ref 3.6–11.0)

## 2018-04-26 LAB — COMPREHENSIVE METABOLIC PANEL
ALK PHOS: 65 U/L (ref 38–126)
ALT: 21 U/L (ref 0–44)
ANION GAP: 7 (ref 5–15)
AST: 25 U/L (ref 15–41)
Albumin: 3.7 g/dL (ref 3.5–5.0)
BILIRUBIN TOTAL: 0.4 mg/dL (ref 0.3–1.2)
BUN: <5 mg/dL — ABNORMAL LOW (ref 6–20)
CALCIUM: 9 mg/dL (ref 8.9–10.3)
CO2: 28 mmol/L (ref 22–32)
CREATININE: 0.51 mg/dL (ref 0.44–1.00)
Chloride: 106 mmol/L (ref 98–111)
Glucose, Bld: 102 mg/dL — ABNORMAL HIGH (ref 70–99)
Potassium: 2.8 mmol/L — ABNORMAL LOW (ref 3.5–5.1)
SODIUM: 141 mmol/L (ref 135–145)
TOTAL PROTEIN: 7.1 g/dL (ref 6.5–8.1)

## 2018-04-26 LAB — BRAIN NATRIURETIC PEPTIDE: B Natriuretic Peptide: 28 pg/mL (ref 0.0–100.0)

## 2018-04-26 MED ORDER — POTASSIUM CHLORIDE ER 10 MEQ PO TBCR: 20.0000 meq | EXTENDED_RELEASE_TABLET | Freq: Two times a day (BID) | ORAL | 0 refills | Status: AC

## 2018-04-26 MED ORDER — POTASSIUM CHLORIDE CRYS ER 20 MEQ PO TBCR
40.0000 meq | EXTENDED_RELEASE_TABLET | Freq: Once | ORAL | Status: AC
  Administered 2018-04-26: 40 meq via ORAL
  Filled 2018-04-26: qty 2

## 2018-04-26 MED ORDER — FUROSEMIDE 20 MG PO TABS: 20.0000 mg | ORAL_TABLET | Freq: Every day | ORAL | 0 refills | Status: AC

## 2018-04-26 MED ORDER — FUROSEMIDE 20 MG PO TABS
20.0000 mg | ORAL_TABLET | Freq: Once | ORAL | Status: AC
  Administered 2018-04-26: 20 mg via ORAL
  Filled 2018-04-26: qty 1

## 2018-04-26 NOTE — ED Notes (Signed)
Pt in no distress, no pain, d/c instructions reviewed with pt.

## 2018-04-26 NOTE — ED Triage Notes (Signed)
Mom states she was kept for 5 days post partum for low potassium and magnesium. Baby came 7 weeks early, [facility].

## 2018-04-26 NOTE — Discharge Instructions (Signed)
Please begin taking your water pill every day to help with the swelling.  Your potassium was a little bit low today and the water pill will make this worse.  Please make sure you take potassium twice a day while you are taking the water pill.  It is very important that you follow-up with your OB gynecologist within 1 week at the latest for recheck and return to the emergency department sooner for any concerns.  It was a pleasure to take care of you today, and thank you for coming to our emergency department.  If you have any questions or concerns before leaving please ask the nurse to grab me and I'm more than happy to go through your aftercare instructions again.  If you were prescribed any opioid pain medication today such as Norco, Vicodin, Percocet, morphine, hydrocodone, or oxycodone please make sure you do not drive when you are taking this medication as it can alter your ability to drive safely.  If you have any concerns once you are home that you are not improving or are in fact getting worse before you can make it to your follow-up appointment, please do not hesitate to call [phone] and come back for further evaluation.  Merrily BrittleNeil Avondre Richens, MD  Results for orders placed or performed during the hospital encounter of 04/26/18  Comprehensive metabolic panel  Result Value Ref Range   Sodium 141 135 - 145 mmol/L   Potassium 2.8 (L) 3.5 - 5.1 mmol/L   Chloride 106 98 - 111 mmol/L   CO2 28 22 - 32 mmol/L   Glucose, Bld 102 (H) 70 - 99 mg/dL   BUN <5 (L) 6 - 20 mg/dL   Creatinine, Ser 1.610.51 0.44 - 1.00 mg/dL   Calcium 9.0 8.9 - 09.610.3 mg/dL   Total Protein 7.1 6.5 - 8.1 g/dL   Albumin 3.7 3.5 - 5.0 g/dL   AST 25 15 - 41 U/L   ALT 21 0 - 44 U/L   Alkaline Phosphatase 65 38 - 126 U/L   Total Bilirubin 0.4 0.3 - 1.2 mg/dL   GFR calc non Af Amer >60 >60 mL/min   GFR calc Af Amer >60 >60 mL/min   Anion gap 7 5 - 15  CBC with Differential  Result Value Ref Range   WBC 5.7 3.6 - 11.0 K/uL   RBC 5.19  3.80 - 5.20 MIL/uL   Hemoglobin 13.2 12.0 - 16.0 g/dL   HCT 04.538.6 40.935.0 - 81.147.0 %   MCV 74.4 (L) 80.0 - 100.0 fL   MCH 25.5 (L) 26.0 - 34.0 pg   MCHC 34.3 32.0 - 36.0 g/dL   RDW 91.414.0 78.211.5 - 95.614.5 %   Platelets 340 150 - 440 K/uL   Neutrophils Relative % 53 %   Neutro Abs 3.0 1.4 - 6.5 K/uL   Lymphocytes Relative 32 %   Lymphs Abs 1.8 1.0 - 3.6 K/uL   Monocytes Relative 12 %   Monocytes Absolute 0.7 0.2 - 0.9 K/uL   Eosinophils Relative 2 %   Eosinophils Absolute 0.1 0 - 0.7 K/uL   Basophils Relative 1 %   Basophils Absolute 0.0 0 - 0.1 K/uL  Urinalysis, Complete w Microscopic  Result Value Ref Range   Color, Urine YELLOW (A) YELLOW   APPearance CLEAR (A) CLEAR   Specific Gravity, Urine 1.011 1.005 - 1.030   pH 7.0 5.0 - 8.0   Glucose, UA NEGATIVE NEGATIVE mg/dL   Hgb urine dipstick NEGATIVE NEGATIVE   Bilirubin Urine  NEGATIVE NEGATIVE   Ketones, ur NEGATIVE NEGATIVE mg/dL   Protein, ur NEGATIVE NEGATIVE mg/dL   Nitrite NEGATIVE NEGATIVE   Leukocytes, UA NEGATIVE NEGATIVE   RBC / HPF 0-5 0 - 5 RBC/hpf   WBC, UA 0-5 0 - 5 WBC/hpf   Bacteria, UA NONE SEEN NONE SEEN   Squamous Epithelial / LPF 0-5 0 - 5   Mucus PRESENT   Brain natriuretic peptide  Result Value Ref Range   B Natriuretic Peptide 28.0 0.0 - 100.0 pg/mL   Dg Chest 2 View  Result Date: 04/26/2018 CLINICAL DATA:  Shortness of breath.  History of diabetes. EXAM: CHEST - 2 VIEW COMPARISON:  None. FINDINGS: Cardiomediastinal silhouette is normal. Bibasilar airspace opacities on frontal radiograph, less conspicuous on lateral radiograph. No pneumothorax. Soft tissue planes and included osseous structures are non suspicious. IMPRESSION: Bibasilar airspace opacities seen with breast attenuation though atelectasis or pneumonia not excluded. Electronically Signed   By: Awilda Metro M.D.   On: 04/26/2018 20:39

## 2018-04-26 NOTE — ED Triage Notes (Signed)
First Nurse Note:  Patient had C section on Monday, arrives today with c/o continued lower leg swelling.  AAOx3.  Skin warm and dry.  NAD

## 2018-04-26 NOTE — ED Triage Notes (Signed)
Pt here with BLE edema that has not gone down since the birth of her baby on 04/19/18. She was diagnosed with pre eclampsia and was told the swelling would go down in a few days. Pt states bilateral leg pain due to swelling. Nad.

## 2018-04-26 NOTE — ED Notes (Signed)
Per Dr Lamont Snowballifenbark, no ekg needed at this time.

## 2018-04-26 NOTE — ED Provider Notes (Signed)
[facility] Emergency Department Provider Note  ____________________________________________   First MD Initiated Contact with Patient 04/26/18 2115     (approximate)  I have reviewed the triage vital signs and the nursing notes.   HISTORY  Chief Complaint Leg Swelling   HPI Gloria Bennett is a 27 y.o. female who self presents the emergency department with roughly 1 week of slowly progressive bilateral lower extremity edema.  She gave birth to a baby prematurely at 33 weeks 1 week ago secondary to preeclampsia.  She did note bilateral lower extremity edema in her third trimester however feels it has become slightly worse since giving birth.  She said that while in the hospital she told the doctor but "no one did anything about it".  Her leg swelling is equal bilaterally.  She does sleep lying flat.  No chest pain or shortness of breath.  No history of DVT or pulmonary embolism.  She does have a history of diabetes mellitus as well as morbid obesity.    Past Medical History:  Diagnosis Date  . Diabetes mellitus without complication (HCC)   . Herpes genitalis   . Obesity     Patient Active Problem List   Diagnosis Date Noted  . Pregnancy related bilateral lower abdominal pain, antepartum 04/10/2018  . Pregnancy 04/10/2018  . Abdominal pain affecting pregnancy, antepartum 03/26/2018  . Round ligament pain 03/26/2018    Past Surgical History:  Procedure Laterality Date  . Repair of ligament in right hand      Prior to Admission medications   Medication Sig Start Date End Date Taking? Authorizing Provider  aspirin 81 MG chewable tablet Chew 81 mg by mouth daily.    [provider]  Doxylamine-Pyridoxine 10-10 MG TBEC Take 1 tablet by mouth 2 (two) times daily. 12/31/17   Willy Eddyobinson, Patrick, MD  furosemide (LASIX) 20 MG tablet Take 1 tablet (20 mg total) by mouth daily. 04/26/18 04/26/19  Merrily Brittleifenbark, Tiawanna Luchsinger, MD  glipiZIDE (GLUCOTROL) 5 MG tablet  Take 1 tablet (5 mg total) by mouth daily before breakfast. 04/22/16 11/14/17  Rebecka ApleyWebster, Allison P, MD  metFORMIN (GLUCOPHAGE) 500 MG tablet Take 500 mg by mouth 2 (two) times daily with a meal.    [provider]  potassium chloride (K-DUR) 10 MEQ tablet Take 2 tablets (20 mEq total) by mouth 2 (two) times daily for 14 days. 04/26/18 05/10/18  Merrily Brittleifenbark, Trenyce Loera, MD  Prenatal Vit-Fe Fumarate-FA (PRENATAL MULTIVITAMIN) TABS tablet Take 1 tablet by mouth daily.    [provider]  ranitidine (ZANTAC) 150 MG tablet Take 150 mg by mouth 2 (two) times daily as needed for heartburn.    [provider]  valACYclovir (VALTREX) 500 MG tablet Take 500 mg by mouth 2 (two) times daily as needed.    [provider]    Allergies Amoxicillin and Penicillins  Family History  Problem Relation Age of Onset  . Diabetes Mellitus II Mother   . Hypertension Mother   . Diabetes Father   . Leukemia Maternal Grandmother   . Heart disease Maternal Grandmother   . Breast cancer Maternal Aunt   . Breast cancer Maternal Aunt     Social History Social History   Tobacco Use  . Smoking status: Never Smoker  . Smokeless tobacco: Never Used  Substance Use Topics  . Alcohol use: No  . Drug use: Never    Review of Systems Constitutional: No fever/chills Cardiovascular: Denies chest pain. Respiratory: Denies shortness of breath. Gastrointestinal: No abdominal  pain.  No nausea, no vomiting.   Musculoskeletal: Positive for leg swelling Neurological: Negative for headaches   ____________________________________________   PHYSICAL EXAM:  VITAL SIGNS: ED Triage Vitals  Enc Vitals Group     BP 04/26/18 1850 (!) 142/82     Pulse Rate 04/26/18 1850 89     Resp 04/26/18 1850 16     Temp 04/26/18 1850 98.8 F (37.1 C)     Temp Source 04/26/18 1850 Oral     SpO2 04/26/18 1850 99 %     Weight 04/26/18 1851 250 lb (113.4 kg)     Height 04/26/18 1851 5\' 7"  (1.702 m)     Head  Circumference --      Peak Flow --      Pain Score 04/26/18 1851 7     Pain Loc --      Pain Edu? --      Excl. in GC? --     Constitutional: Alert and oriented x4 pleasant cooperative speaks in full clear sentences no diaphoresis Mouth/Throat: No trismus Neck: No stridor.  Able to lie completely flat with no JVD  cardiovascular: Regular rate and rhythm Respiratory: Normal respiratory effort.  No retractions.  Clear to auscultation bilaterally no crackles at the bases MSK: Bilateral lower extremities with 3+ pitting edema to knees bilaterally although legs are equal in size no erythema no cords Neurologic:  Normal speech and language. No gross focal neurologic deficits are appreciated.  Skin:  Skin is warm, dry and intact. No rash noted.    ____________________________________________  LABS (all labs ordered are listed, but only abnormal results are displayed)  Labs Reviewed  COMPREHENSIVE METABOLIC PANEL - Abnormal; Notable for the following components:      Result Value   Potassium 2.8 (*)    Glucose, Bld 102 (*)    BUN <5 (*)    All other components within normal limits  CBC WITH DIFFERENTIAL/PLATELET - Abnormal; Notable for the following components:   MCV 74.4 (*)    MCH 25.5 (*)    All other components within normal limits  URINALYSIS, COMPLETE (UACMP) WITH MICROSCOPIC - Abnormal; Notable for the following components:   Color, Urine YELLOW (*)    APPearance CLEAR (*)    All other components within normal limits  BRAIN NATRIURETIC PEPTIDE    Lab work reviewed by me shows mild hypokalemia __________________________________________  EKG   ____________________________________________  RADIOLOGY  Chest x-ray reviewed by me with no acute disease noted ____________________________________________   DIFFERENTIAL includes but not limited to  Preeclampsia, postpartum heart failure, hypoalbuminemia, nephrotic syndrome, myxedema   PROCEDURES  Procedure(s)  performed: no  Procedures  Critical Care performed: no  ____________________________________________   INITIAL IMPRESSION / ASSESSMENT AND PLAN / ED COURSE  Pertinent labs & imaging results that were available during my care of the patient were reviewed by me and considered in my medical decision making (see chart for details).   As part of my medical decision making, I reviewed the following data within the electronic MEDICAL RECORD NUMBER History obtained from family if available, nursing notes, old chart and ekg, as well as notes from prior ED visits.  The patient is well-appearing although does have significant bilateral lower extremity edema.  She is able to lie completely flat and BNP is unremarkable along with normal chest x-ray suggesting against heart failure.  Her albumin is normal as well as her LFTs suggesting against preeclampsia.  She has no proteinuria.  Unclear etiology of her  peripheral edema although it could just be normal swelling secondary to her recent pregnant state.  I will begin her on low-dose potassium 20 mg a day but only for 2 weeks.  I appreciate her hypokalemia and have repleted it here along with her Lasix.  I discussed with the patient that she must take potassium along with her Lasix as Lasix will drop her potassium and could be dangerous in her state.  She verbalizes understanding and agreement the plan.  She has not attempted to contact her OB gynecologist and I have encouraged her to follow-up this week for recheck.  Strict return precautions have been given.      ____________________________________________   FINAL CLINICAL IMPRESSION(S) / ED DIAGNOSES  Final diagnoses:  Peripheral edema  Hypokalemia      NEW MEDICATIONS STARTED DURING THIS VISIT:  Discharge Medication List as of 04/26/2018  9:35 PM    START taking these medications   Details  furosemide (LASIX) 20 MG tablet Take 1 tablet (20 mg total) by mouth daily., Starting Mon 04/26/2018,  Until Tue 04/26/2019, Print    potassium chloride (K-DUR) 10 MEQ tablet Take 2 tablets (20 mEq total) by mouth 2 (two) times daily for 14 days., Starting Mon 04/26/2018, Until Mon 05/10/2018, Print         Note:  This document was prepared using Dragon voice recognition software and may include unintentional dictation errors.      Merrily Brittle, MD 04/27/18 1513
